# Patient Record
Sex: Male | Born: 1965 | Race: White | Hispanic: No | Marital: Single | Smoking: Current every day smoker
Health system: Southern US, Community
[De-identification: ages and names within clinical notes are randomized; demographics above are authoritative.]

## PROBLEM LIST (undated history)

## (undated) DIAGNOSIS — E0590 Thyrotoxicosis, unspecified without thyrotoxic crisis or storm: Secondary | ICD-10-CM

---

## 2003-03-14 ENCOUNTER — Encounter: Admission: RE | Admit: 2003-03-14 | Discharge: 2003-06-12 | Payer: Self-pay | Admitting: Family Medicine

## 2011-08-30 ENCOUNTER — Other Ambulatory Visit (HOSPITAL_COMMUNITY): Payer: Self-pay | Admitting: Endocrinology

## 2011-08-30 DIAGNOSIS — E0590 Thyrotoxicosis, unspecified without thyrotoxic crisis or storm: Secondary | ICD-10-CM

## 2011-09-10 ENCOUNTER — Encounter (HOSPITAL_COMMUNITY)
Admission: RE | Admit: 2011-09-10 | Discharge: 2011-09-10 | Disposition: A | Discharge status: Home / Self Care | Payer: Medicare Other | Source: Ambulatory Visit | Attending: Endocrinology | Admitting: Endocrinology

## 2011-09-10 DIAGNOSIS — E0590 Thyrotoxicosis, unspecified without thyrotoxic crisis or storm: Secondary | ICD-10-CM | POA: Insufficient documentation

## 2011-09-11 ENCOUNTER — Encounter (HOSPITAL_COMMUNITY): Payer: Self-pay

## 2011-09-11 ENCOUNTER — Encounter (HOSPITAL_COMMUNITY)
Admission: RE | Admit: 2011-09-11 | Discharge: 2011-09-11 | Disposition: A | Discharge status: Home / Self Care | Payer: Medicare Other | Source: Ambulatory Visit | Attending: Endocrinology | Admitting: Endocrinology

## 2011-09-11 HISTORY — DX: Thyrotoxicosis, unspecified without thyrotoxic crisis or storm: E05.90

## 2011-09-11 MED ORDER — SODIUM IODIDE I 131 CAPSULE
8.6000 | Freq: Once | INTRAVENOUS | Status: AC | PRN
  Administered 2011-09-10: 8.6 via ORAL

## 2011-09-11 MED ORDER — SODIUM PERTECHNETATE TC 99M INJECTION
10.0000 | Freq: Once | INTRAVENOUS | Status: AC | PRN
  Administered 2011-09-11: 10 via INTRAVENOUS

## 2011-12-09 ENCOUNTER — Other Ambulatory Visit: Payer: Self-pay | Admitting: Internal Medicine

## 2011-12-09 DIAGNOSIS — R945 Abnormal results of liver function studies: Secondary | ICD-10-CM

## 2011-12-12 ENCOUNTER — Other Ambulatory Visit (HOSPITAL_COMMUNITY): Payer: Self-pay | Admitting: Internal Medicine

## 2011-12-12 DIAGNOSIS — R7989 Other specified abnormal findings of blood chemistry: Secondary | ICD-10-CM

## 2011-12-12 DIAGNOSIS — R945 Abnormal results of liver function studies: Secondary | ICD-10-CM

## 2011-12-23 ENCOUNTER — Ambulatory Visit
Admission: RE | Admit: 2011-12-23 | Discharge: 2011-12-23 | Disposition: A | Discharge status: Home / Self Care | Payer: Medicaid Other | Source: Ambulatory Visit | Attending: Internal Medicine | Admitting: Internal Medicine

## 2011-12-23 DIAGNOSIS — R945 Abnormal results of liver function studies: Secondary | ICD-10-CM

## 2011-12-25 ENCOUNTER — Encounter (HOSPITAL_COMMUNITY)
Admission: RE | Admit: 2011-12-25 | Discharge: 2011-12-25 | Disposition: A | Discharge status: Home / Self Care | Payer: Medicare Other | Source: Ambulatory Visit | Attending: Internal Medicine | Admitting: Internal Medicine

## 2011-12-25 DIAGNOSIS — R748 Abnormal levels of other serum enzymes: Secondary | ICD-10-CM | POA: Insufficient documentation

## 2011-12-25 DIAGNOSIS — R945 Abnormal results of liver function studies: Secondary | ICD-10-CM

## 2011-12-25 MED ORDER — TECHNETIUM TC 99M MEDRONATE IV KIT
24.0000 | PACK | Freq: Once | INTRAVENOUS | Status: AC | PRN
  Administered 2011-12-25: 24 via INTRAVENOUS

## 2013-10-08 IMAGING — US US ABDOMEN LIMITED
1 series · 14 of 25 positions shown · non-contrast
Comparison: No priors.

CLINICAL DATA: Abnormal liver function tests.

LIMITED ABDOMINAL ULTRASOUND - RIGHT UPPER QUADRANT

[Series 1: us abdomen limited · 0.28mm/px · 14 of 43 slices shown]
[im 1/43]
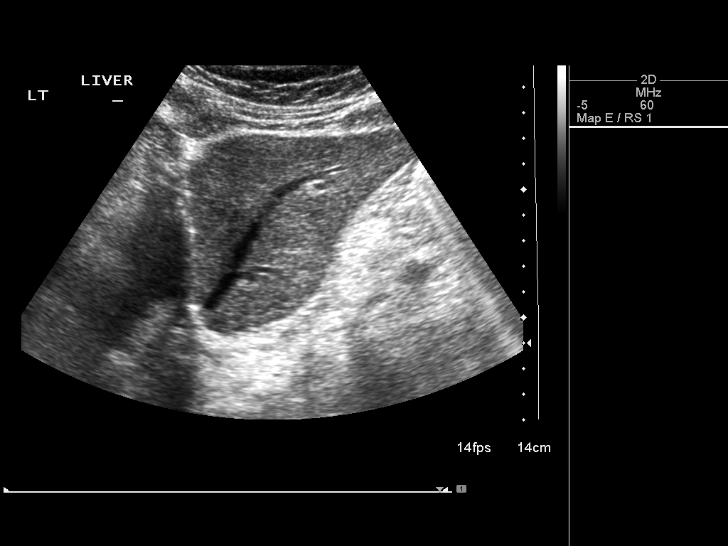
[im 4/43]
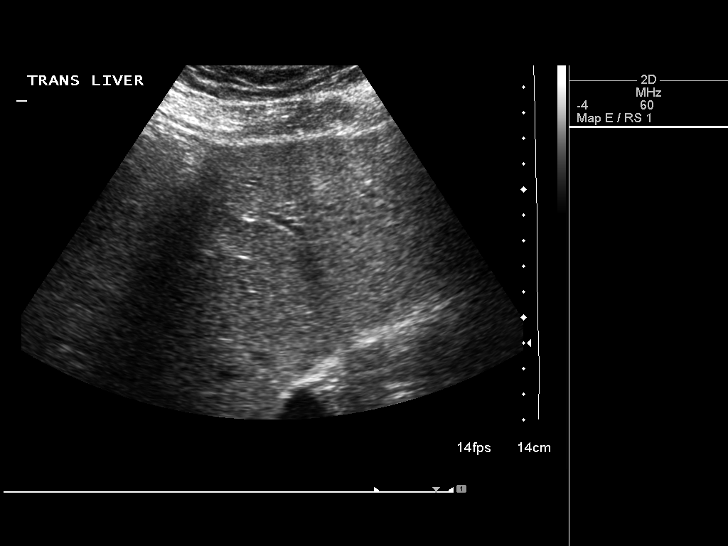
[im 8/43]
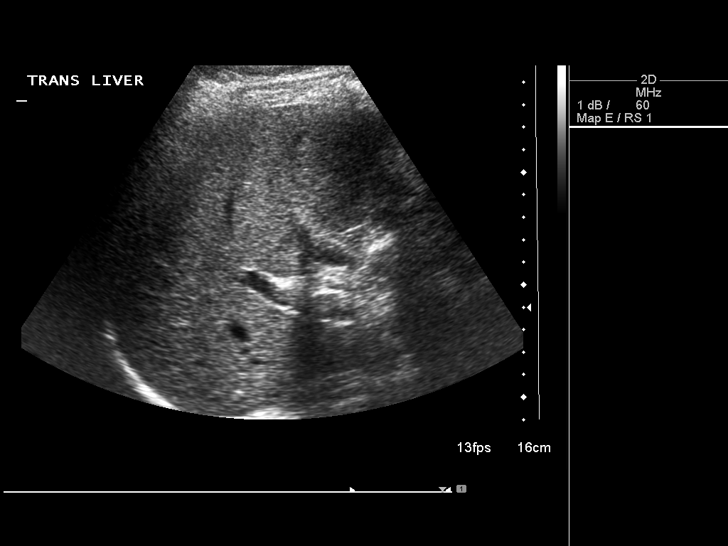
[im 11/43]
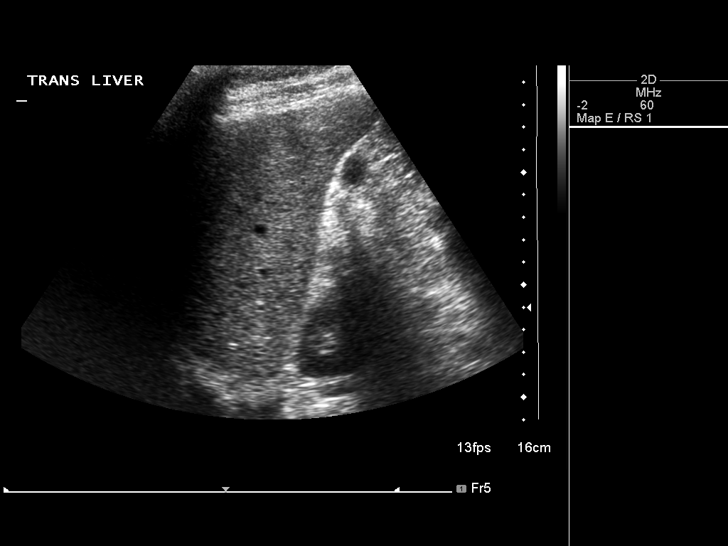
[im 15/43]
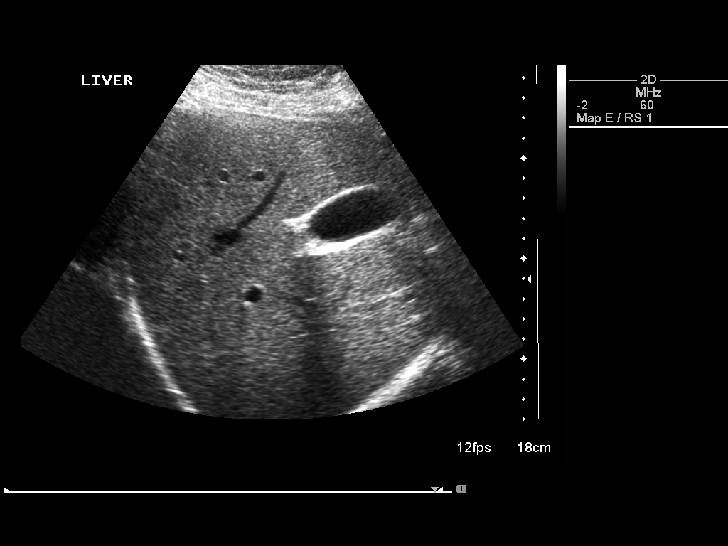
[im 16/43]
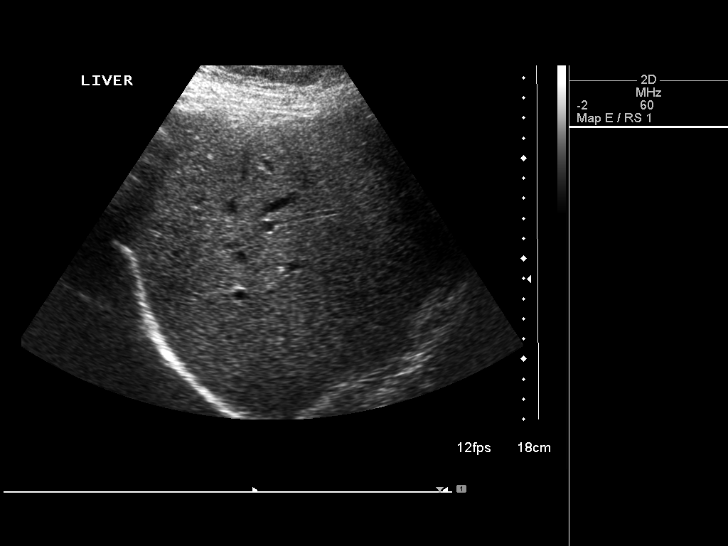
[im 20/43]
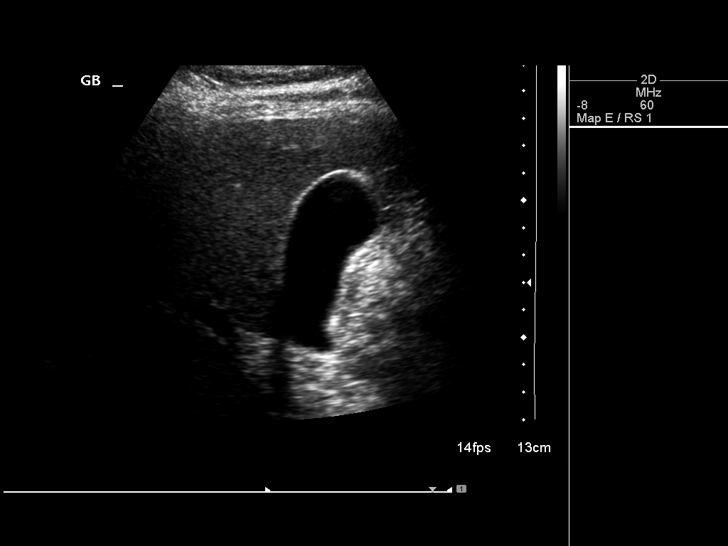
[im 23/43]
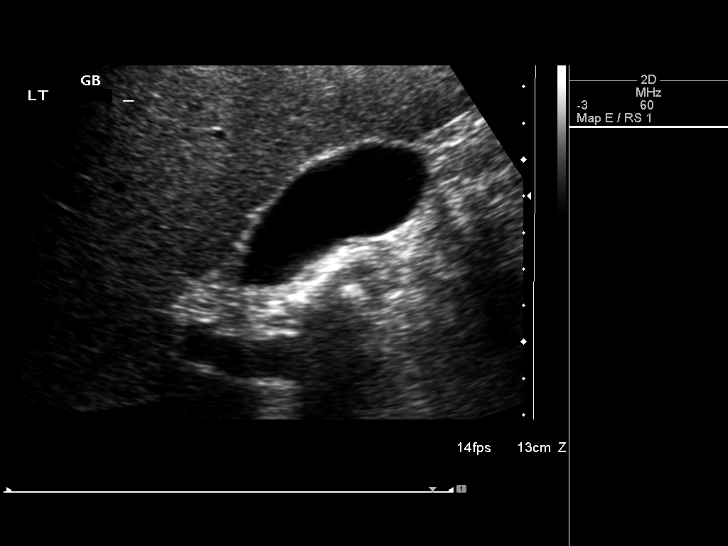
[im 27/43]
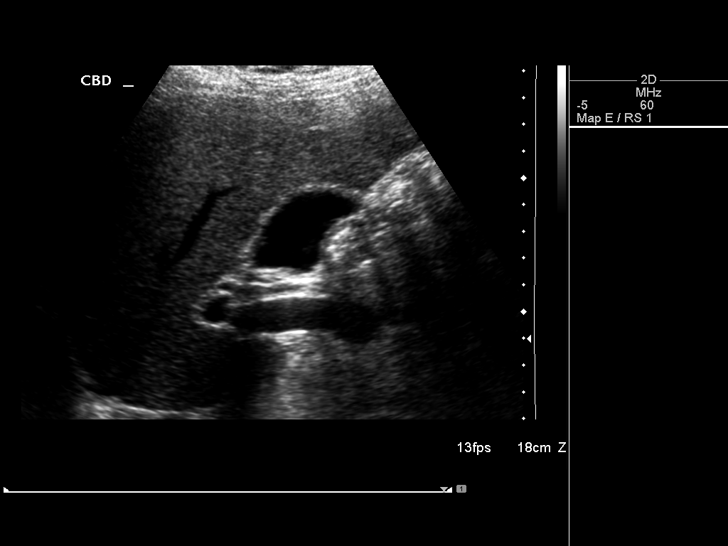
[im 29/43]
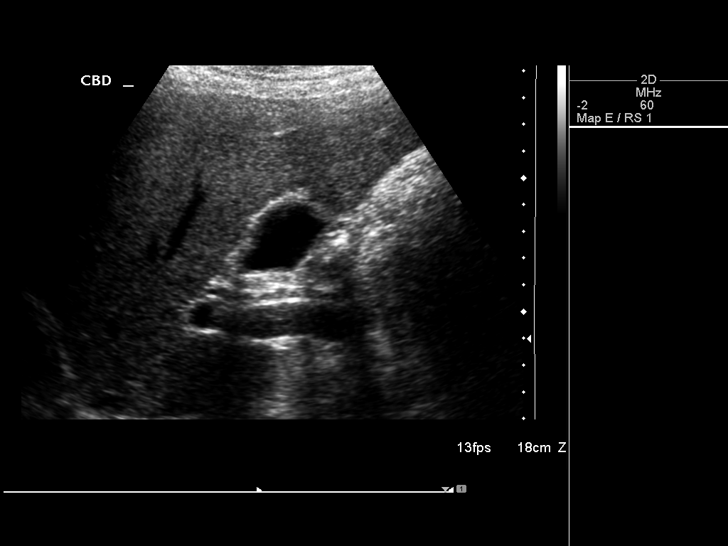
[im 32/43]
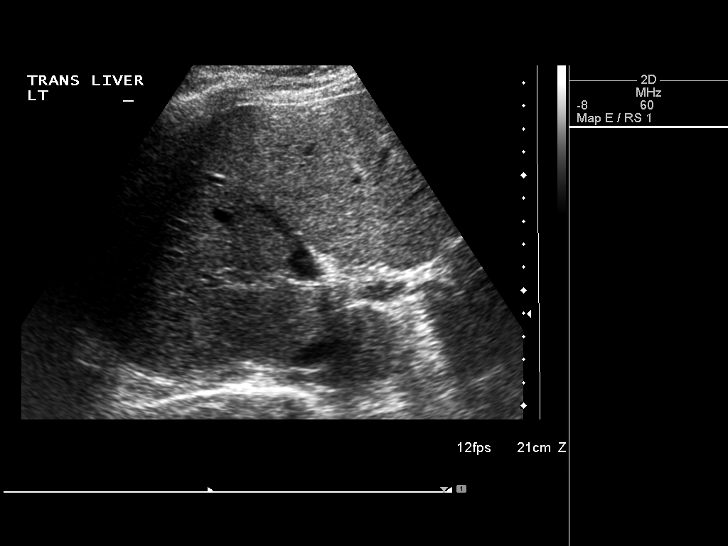
[im 36/43]
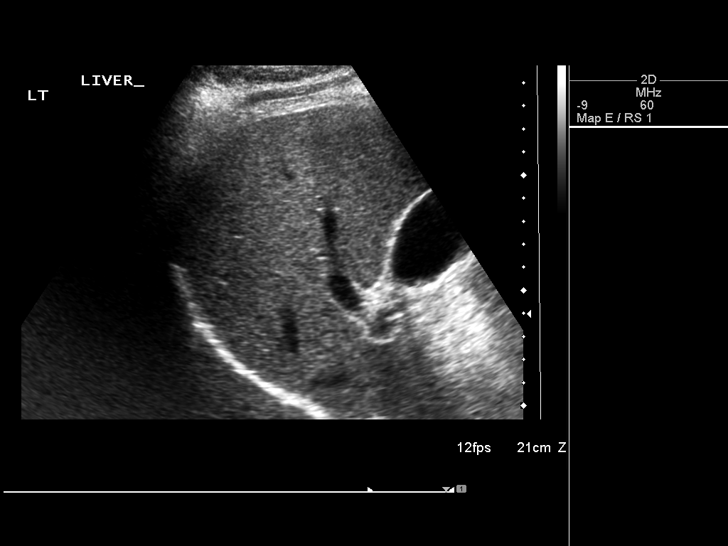
[im 39/43]
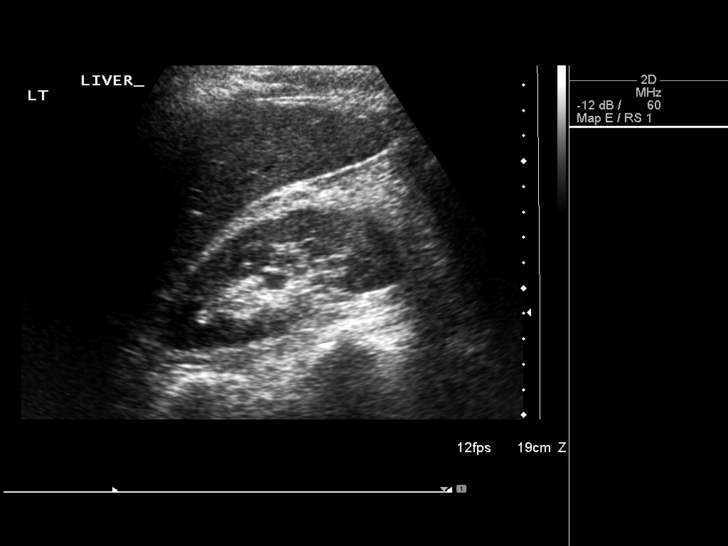
[im 43/43]
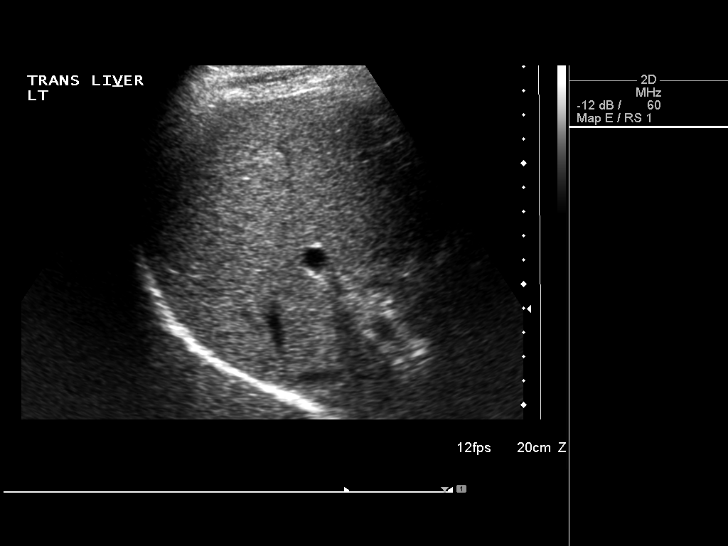

[14 of 25 positions shown; findings below may reference images not displayed]

FINDINGS: Gallbladder:  No shadowing gallstones or echogenic sludge.  No
gallbladder wall thickening or pericholecystic fluid.  Negative
sonographic Murphy's sign according to the ultrasound technologist.

Common bile duct:  Normal in caliber measuring 3.7 mm in the porta
hepatis.

Liver:  Normal size and echotexture without focal parenchymal
abnormality.  Patent portal vein with hepatopetal flow.
IMPRESSION: No acute findings in the right upper quadrant to account for the
patient's symptoms.

## 2015-08-31 ENCOUNTER — Encounter: Payer: Self-pay | Admitting: Podiatry

## 2015-08-31 ENCOUNTER — Ambulatory Visit (INDEPENDENT_AMBULATORY_CARE_PROVIDER_SITE_OTHER): Payer: Medicare Other | Admitting: Podiatry

## 2015-08-31 VITALS — BP 128/82 | HR 93 | Resp 14

## 2015-08-31 DIAGNOSIS — M79676 Pain in unspecified toe(s): Secondary | ICD-10-CM

## 2015-08-31 DIAGNOSIS — B351 Tinea unguium: Secondary | ICD-10-CM | POA: Diagnosis not present

## 2015-08-31 NOTE — Progress Notes (Signed)
   Subjective:    Patient ID: Tony RaiderWilliam M Hypolite, male    DOB: 12/24/1965, 50 y.o.   MRN: [id number]  HPI this patient presents to the office for an evaluation of his toenails, both feet. He says that he has pain and discomfort especially in the big toe of the right foot. The toe does not sit  properly, but grows up from the nailbed. This is painful walking and wearing his shoes. He presents the office for an evaluation and treatment of this condition    Review of Systems  All other systems reviewed and are negative.      Objective:   Physical Exam GENERAL APPEARANCE: Alert, conversant. Appropriately groomed. No acute distress.  VASCULAR: Pedal pulses [facility]  [facility].  Capillary refill time is immediate to all digits,  Normal temperature gradient.  Digital hair growth is present bilateral  NEUROLOGIC: sensation is normal to 5.07 monofilament at 5/5 sites bilateral.  Light touch is intact bilateral, Muscle strength normal.  MUSCULOSKELETAL: acceptable muscle strength, tone and stability bilateral.  Intrinsic muscluature intact bilateral.  Rectus appearance of foot and digits noted bilateral.   DERMATOLOGIC: skin color, texture, and turgor are within normal limits.  No preulcerative lesions or ulcers  are seen, no interdigital maceration noted.  No open lesions present.  . No drainage noted.  NAILS  Thick disfigured discolored nails 1-5 right foot and 1 left foot. No signs of infection or ulcers.      Assessment & Plan:  Ontchomycosis B/L   IE  Debride nails B/L  RTC 6 months  Discussed conservative vs. Surgical treatment.   Helane GuntherGregory Farrie Sann DPM   Helane GuntherGregory Sabre Romberger DPM

## 2016-02-15 ENCOUNTER — Ambulatory Visit: Payer: Medicare Other | Admitting: Podiatry

## 2016-02-22 ENCOUNTER — Ambulatory Visit (INDEPENDENT_AMBULATORY_CARE_PROVIDER_SITE_OTHER): Payer: Medicare Other | Admitting: Podiatry

## 2016-02-22 DIAGNOSIS — B351 Tinea unguium: Secondary | ICD-10-CM

## 2016-02-22 DIAGNOSIS — M79676 Pain in unspecified toe(s): Secondary | ICD-10-CM | POA: Diagnosis not present

## 2016-02-22 NOTE — Progress Notes (Signed)
   Subjective:    Patient ID: Tony Moyer, male    DOB: 03-Dec-1965, 50 y.o.   MRN: [id number]  HPI this patient presents to the office for an evaluation of his toenails, both feet. He says that he has pain and discomfort especially in the big toe of the right foot. The toe does not sit  properly, but grows up from the nailbed. This is painful walking and wearing his shoes. He presents the office for an evaluation and treatment of this condition    Review of Systems  All other systems reviewed and are negative.      Objective:   Physical Exam GENERAL APPEARANCE: Alert, conversant. Appropriately groomed. No acute distress.  VASCULAR: Pedal pulses are  [facility]pable at  [facility] bilateral.  Capillary refill time is immediate to all digits,  Normal temperature gradient.  Digital hair growth is present bilateral  NEUROLOGIC: sensation is normal to 5.07 monofilament at 5/5 sites bilateral.  Light touch is intact bilateral, Muscle strength normal.  MUSCULOSKELETAL: acceptable muscle strength, tone and stability bilateral.  Intrinsic muscluature intact bilateral.  Rectus appearance of foot and digits noted bilateral.   DERMATOLOGIC: skin color, texture, and turgor are within normal limits.  No preulcerative lesions or ulcers  are seen, no interdigital maceration noted.  No open lesions present.  . No drainage noted.  NAILS  Thick disfigured discolored nails 1-5 right foot and 1 left foot. No signs of infection or ulcers.      Assessment & Plan:  Ontchomycosis B/L   IE  Debride nails B/L  RTC 4 months    Helane GuntherGregory Jamontae Moyer DPM

## 2016-06-27 ENCOUNTER — Ambulatory Visit (INDEPENDENT_AMBULATORY_CARE_PROVIDER_SITE_OTHER): Payer: Medicare Other | Admitting: Podiatry

## 2016-06-27 DIAGNOSIS — B351 Tinea unguium: Secondary | ICD-10-CM

## 2016-06-27 DIAGNOSIS — M79676 Pain in unspecified toe(s): Secondary | ICD-10-CM

## 2016-06-27 NOTE — Progress Notes (Signed)
   Subjective:    Patient ID: Tony Moyer, male    DOB: 1966/03/05, 51 y.o.   MRN: [id number]  HPI this patient presents to the office for an evaluation of his toenails, both feet. He says that he has pain and discomfort especially in the big toe of the right foot.  This is painful walking and wearing his shoes. He presents the office for an evaluation and treatment of this condition    Review of Systems  All other systems reviewed and are negative.      Objective:   Physical Exam GENERAL APPEARANCE: Alert, conversant. Appropriately groomed. No acute distress.  VASCULAR: [facility]  [facility].  Capillary refill time is immediate to all digits,  Normal temperature gradient.  Digital hair growth is present bilateral  NEUROLOGIC: sensation is normal to 5.07 monofilament at 5/5 sites bilateral.  Light touch is intact bilateral, Muscle strength normal.  MUSCULOSKELETAL: acceptable muscle strength, tone and stability bilateral.  Intrinsic muscluature intact bilateral.  Rectus appearance of foot and digits noted bilateral.   DERMATOLOGIC: skin color, texture, and turgor are within normal limits.  No preulcerative lesions or ulcers  are seen, no interdigital maceration noted.  No open lesions present.  . No drainage noted.  NAILS  Thick disfigured discolored nails 1-5 right foot and 1 left foot. No signs of infection or ulcers.      Assessment & Plan:  Ontchomycosis B/L   IE  Debride nails B/L  RTC 4 months    Helane GuntherGregory Yu Cragun DPM

## 2016-10-16 ENCOUNTER — Ambulatory Visit (INDEPENDENT_AMBULATORY_CARE_PROVIDER_SITE_OTHER): Payer: Medicare Other | Admitting: Podiatry

## 2016-10-16 ENCOUNTER — Encounter: Payer: Self-pay | Admitting: Podiatry

## 2016-10-16 DIAGNOSIS — M79676 Pain in unspecified toe(s): Secondary | ICD-10-CM | POA: Diagnosis not present

## 2016-10-16 DIAGNOSIS — B351 Tinea unguium: Secondary | ICD-10-CM | POA: Diagnosis not present

## 2016-10-16 NOTE — Progress Notes (Signed)
   Subjective:    Patient ID: Tony Moyer, male    DOB: 1965/08/01, 51 y.o.   MRN: [id number]  HPI this patient presents to the office for an evaluation of his toenails, both feet. He says that he has pain and discomfort especially in the big toe of the right foot.  This is painful walking and wearing his shoes. He presents the office for an evaluation and treatment of this condition    Review of Systems  All other systems reviewed and are negative.      Objective:   Physical Exam GENERAL APPEARANCE: Alert, conversant. Appropriately groomed. No acute distress.  VASCULAR: Pedal pulses are  [facility]pable [facility]  [facility].  Capillary refill time is immediate to all digits,  Normal temperature gradient.  Digital hair growth is present bilateral  NEUROLOGIC: sensation is normal to 5.07 monofilament at 5/5 sites bilateral.  Light touch is intact bilateral, Muscle strength normal.  MUSCULOSKELETAL: acceptable muscle strength, tone and stability bilateral.  Intrinsic muscluature intact bilateral.  Rectus appearance of foot and digits noted bilateral.   DERMATOLOGIC: skin color, texture, and turgor are within normal limits.  No preulcerative lesions or ulcers  are seen, no interdigital maceration noted.  No open lesions present.  . No drainage noted.  NAILS  Thick disfigured discolored nails 1-5 right foot and 1 left foot. No signs of infection or ulcers.      Assessment & Plan:  Ontchomycosis B/L   IE  Debride nails B/L  RTC 4 months    Helane GuntherGregory Madgie Dhaliwal DPM

## 2020-03-27 ENCOUNTER — Encounter: Payer: Self-pay | Admitting: Podiatry

## 2020-03-27 ENCOUNTER — Ambulatory Visit (INDEPENDENT_AMBULATORY_CARE_PROVIDER_SITE_OTHER): Payer: Medicare Other | Admitting: Podiatry

## 2020-03-27 ENCOUNTER — Other Ambulatory Visit: Payer: Self-pay

## 2020-03-27 DIAGNOSIS — M79674 Pain in right toe(s): Secondary | ICD-10-CM | POA: Diagnosis not present

## 2020-03-27 DIAGNOSIS — L600 Ingrowing nail: Secondary | ICD-10-CM | POA: Diagnosis not present

## 2020-03-27 DIAGNOSIS — B351 Tinea unguium: Secondary | ICD-10-CM

## 2020-03-30 NOTE — Progress Notes (Signed)
Subjective:   Patient ID: Tony Moyer, male   DOB: 54 y.o.   MRN: [id number]   HPI Patient presents stating that his nails on his right foot has become very tender and he cannot cut them himself and he knows that he does need to have the nail removed permanently but he is looking for a temporary solution for the holidays.  Patient states that he tries to trim them himself and tries different modalities without relief and the big one especially is very bothersome.  Patient does smoke occasionally and is not currently active   Review of Systems  All other systems reviewed and are negative.       Objective:  Physical Exam Vitals and nursing note reviewed.  Constitutional:      Appearance: He is well-developed.  Pulmonary:     Effort: Pulmonary effort is normal.  Musculoskeletal:        General: Normal range of motion.  Skin:    General: Skin is warm.  Neurological:     Mental Status: He is alert.     Neurovascular status intact muscle strength was found to be adequate range of motion within normal limits.  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  I did explain that nail removal permanent is the best long-term option for this and patient wants this type of procedure but needs to hold off and I educated him on the recovery from nail removal.  Today debridement of nailbeds 1-5 right foot was accomplished with no iatrogenic bleeding and patient will be seen back as needed

## 2020-05-17 ENCOUNTER — Other Ambulatory Visit: Payer: Self-pay

## 2020-05-17 ENCOUNTER — Ambulatory Visit (INDEPENDENT_AMBULATORY_CARE_PROVIDER_SITE_OTHER): Payer: Medicare Other | Admitting: Podiatry

## 2020-05-17 ENCOUNTER — Encounter: Payer: Self-pay | Admitting: Podiatry

## 2020-05-17 DIAGNOSIS — M779 Enthesopathy, unspecified: Secondary | ICD-10-CM | POA: Diagnosis not present

## 2020-05-17 DIAGNOSIS — M2041 Other hammer toe(s) (acquired), right foot: Secondary | ICD-10-CM | POA: Diagnosis not present

## 2020-05-17 DIAGNOSIS — M79674 Pain in right toe(s): Secondary | ICD-10-CM

## 2020-05-17 DIAGNOSIS — B351 Tinea unguium: Secondary | ICD-10-CM

## 2020-05-17 NOTE — Progress Notes (Signed)
Subjective:   Patient ID: Tony Moyer, male   DOB: 55 y.o.   MRN: [id number]   HPI Patient states he developed a lot of pain between the fourth and fifth toes in the right foot and feels like it is sticking between his toes.  Does not remember specific injury to the area   ROS      Objective:  Physical Exam  Neurovascular status intact with significant rotation digit 5 right with inflammation fluid in the inner phalangeal joint digit 4 left with keratotic lesion formation     Assessment:  Hammertoe deformity with structural imbalance fourth and fifth digits with inflammatory capsulitis inner phalangeal joint digit 4 left     Plan:  H&P reviewed condition went ahead today and did sterile prep and injected the inner phalangeal joint digit 4 left lateral side and then debrided lesions discussed hammertoe and the possibility for digital correction of digits 4 and 5 left foot depending on response to conservative care with padding applied

## 2022-08-02 ENCOUNTER — Encounter: Payer: Self-pay | Admitting: Podiatry

## 2022-08-02 ENCOUNTER — Ambulatory Visit (INDEPENDENT_AMBULATORY_CARE_PROVIDER_SITE_OTHER): Payer: Medicare Other | Admitting: Podiatry

## 2022-08-02 DIAGNOSIS — M79674 Pain in right toe(s): Secondary | ICD-10-CM | POA: Diagnosis not present

## 2022-08-02 DIAGNOSIS — B351 Tinea unguium: Secondary | ICD-10-CM | POA: Diagnosis not present

## 2022-08-02 DIAGNOSIS — M79675 Pain in left toe(s): Secondary | ICD-10-CM

## 2022-08-02 DIAGNOSIS — D169 Benign neoplasm of bone and articular cartilage, unspecified: Secondary | ICD-10-CM | POA: Diagnosis not present

## 2022-08-03 NOTE — Progress Notes (Signed)
Subjective:   Patient ID: Tony Moyer, male   DOB: 57 y.o.   MRN: [id number]   HPI Patient presents with a painful bone spur between the fourth and fifth digits on his right foot and also has nail disease to get very thickened and dystrophic both feet that he cannot take care of.  Has not been treated for a long time   ROS      Objective:  Physical Exam  Neurovascular status intact no significant changes health history with a exostotic lesion fifth digit right very painful with pressure against the fourth toe and lesion formation that is formed around it.  Patient is noted to have severely thickened elongated nailbeds 1-5 both feet painful     Assessment:  Bone spur formation right chronic and painful along with severe mycotic nail infections 1-5 both feet     Plan:  H&P all conditions reviewed and I discussed exostectomy fifth digit which I think will be necessary but he cannot do now and temporary debridement courtesy provided.  Educated him on the surgical procedure and removal of the bone spur that can be done in the office and today I debrided nailbeds 1-5 both feet no angiogenic bleeding

## 2022-10-21 ENCOUNTER — Ambulatory Visit (INDEPENDENT_AMBULATORY_CARE_PROVIDER_SITE_OTHER): Payer: Medicare Other

## 2022-10-21 ENCOUNTER — Encounter: Payer: Self-pay | Admitting: Podiatry

## 2022-10-21 ENCOUNTER — Ambulatory Visit (INDEPENDENT_AMBULATORY_CARE_PROVIDER_SITE_OTHER): Payer: Medicare Other | Admitting: Podiatry

## 2022-10-21 DIAGNOSIS — M2041 Other hammer toe(s) (acquired), right foot: Secondary | ICD-10-CM | POA: Diagnosis not present

## 2022-10-21 DIAGNOSIS — D169 Benign neoplasm of bone and articular cartilage, unspecified: Secondary | ICD-10-CM

## 2022-10-21 NOTE — Progress Notes (Signed)
Subjective:   Patient ID: Tony Moyer, male   DOB: 57 y.o.   MRN: [id number]   HPI Patient presents with chronic pain between the fourth and fifth toes on the right foot and on the outside of the right fifth toe.  States this has been an ongoing event and states that when we trimmed it a couple months ago he got relief for just a few weeks has tried wider shoes and padding without relief   ROS      Objective:  Physical Exam  Neurovascular status intact with significant rotation digits 4 and 5 right with pain on the fifth digit inside of the toe along with the fourth and fifth digits     Assessment:  Hammertoe deformity digits 4 and 5 right distal keratotic lesion medial side digit 5 right with the fifth digit being distal rotation     Plan:  H&P reviewed all conditions with patient.  Patient's trimming only gave him short-term relief and I do think at this point arthroplasty of the fourth and fifth toes and exostectomy digit 5 is necessary.  Patient agrees wants this done I allowed him to read consent form going over alternative treatments complications he is willing to accept risk and after review signed consent form.  Patient scheduled outpatient surgery all questions answered and understands total recovery can take for 3 to 6 months  X-rays indicate significant rotation of the fourth and fifth digits right with pressure between the 2 toes

## 2022-11-04 MED ORDER — HYDROCODONE-ACETAMINOPHEN 10-325 MG PO TABS: 1.0000 | ORAL_TABLET | Freq: Three times a day (TID) | ORAL | 0 refills | Status: AC | PRN

## 2022-11-04 NOTE — Addendum Note (Signed)
Addended by: Lenn Sink on: 11/04/2022 05:10 PM   Modules accepted: Orders

## 2022-11-05 DIAGNOSIS — M2041 Other hammer toe(s) (acquired), right foot: Secondary | ICD-10-CM | POA: Diagnosis not present

## 2022-11-05 DIAGNOSIS — M25774 Osteophyte, right foot: Secondary | ICD-10-CM | POA: Diagnosis not present

## 2022-11-07 ENCOUNTER — Encounter: Payer: Self-pay | Admitting: Podiatry

## 2022-11-11 ENCOUNTER — Ambulatory Visit (INDEPENDENT_AMBULATORY_CARE_PROVIDER_SITE_OTHER): Payer: Medicare Other | Admitting: Podiatry

## 2022-11-11 ENCOUNTER — Ambulatory Visit (INDEPENDENT_AMBULATORY_CARE_PROVIDER_SITE_OTHER): Payer: Medicare Other

## 2022-11-11 ENCOUNTER — Encounter: Payer: Self-pay | Admitting: Podiatry

## 2022-11-11 DIAGNOSIS — M2041 Other hammer toe(s) (acquired), right foot: Secondary | ICD-10-CM

## 2022-11-11 DIAGNOSIS — Z9889 Other specified postprocedural states: Secondary | ICD-10-CM

## 2022-11-12 ENCOUNTER — Other Ambulatory Visit: Payer: Self-pay | Admitting: Podiatry

## 2022-11-12 DIAGNOSIS — Z9889 Other specified postprocedural states: Secondary | ICD-10-CM

## 2022-11-12 NOTE — Progress Notes (Signed)
Subjective:   Patient ID: Tony Moyer, male   DOB: 57 y.o.   MRN: [id number]   HPI Patient states doing very well with surgery very pleased   ROS      Objective:  Physical Exam  Ocular status intact negative Denna Haggard' sign noted right foot healing well wound edges well coapted     Assessment:  Doing well post digital repair exostectomy right     Plan:  Reviewed with patient and at this point reapplied sterile dressing continue elevation compression reappoint for suture removal 2 weeks  X-rays indicate good healing of the sites with everything intact good digital positioning and satisfactory resection of bone

## 2022-11-25 ENCOUNTER — Encounter: Payer: Self-pay | Admitting: Podiatry

## 2022-11-25 ENCOUNTER — Ambulatory Visit: Payer: Medicare Other | Admitting: Podiatry

## 2022-11-25 DIAGNOSIS — Z9889 Other specified postprocedural states: Secondary | ICD-10-CM

## 2022-11-26 NOTE — Progress Notes (Signed)
Patient states having mid subjective:   Patient ID: Tony Moyer, male   DOB: 57 y.o.   MRN: [id number]   HPI Normal discomfort at this time   ROS      Objective:  Physical Exam  Neurovascular status intact negative Denna Haggard' sign noted wound edges well coapted stitches in place fourth fifth digits right foot minimal edema     Assessment:  Doing well post arthroplasty 4 5 exostectomy digit 5 right     Plan:  Reviewed condition and stitches removed wound edges coapted well gradual return to soft shoe patient will be seen back as symptoms indicate
